# Patient Record
Sex: Female | Born: 1960 | Race: White | Hispanic: No | Marital: Married | Smoking: Former smoker
Health system: Southern US, Community
[De-identification: ages and names within clinical notes are randomized; demographics above are authoritative.]

## PROBLEM LIST (undated history)

## (undated) HISTORY — PX: CHOLECYSTECTOMY: SHX55

---

## 2003-07-21 HISTORY — PX: THYROIDECTOMY: SHX17

## 2005-07-20 HISTORY — PX: GASTRIC BYPASS: SHX52

## 2013-08-25 LAB — BASIC METABOLIC PANEL: GLUCOSE: 125 mg/dL

## 2014-02-01 LAB — HM MAMMOGRAPHY

## 2014-09-28 ENCOUNTER — Encounter: Payer: Self-pay | Admitting: Physician Assistant

## 2014-09-28 ENCOUNTER — Ambulatory Visit (INDEPENDENT_AMBULATORY_CARE_PROVIDER_SITE_OTHER): Payer: BLUE CROSS/BLUE SHIELD | Admitting: Physician Assistant

## 2014-09-28 VITALS — BP 144/86 | HR 101 | Ht 66.0 in | Wt 241.0 lb

## 2014-09-28 DIAGNOSIS — E039 Hypothyroidism, unspecified: Secondary | ICD-10-CM

## 2014-09-28 DIAGNOSIS — G4700 Insomnia, unspecified: Secondary | ICD-10-CM

## 2014-09-28 DIAGNOSIS — Z9884 Bariatric surgery status: Secondary | ICD-10-CM | POA: Diagnosis not present

## 2014-09-28 DIAGNOSIS — I10 Essential (primary) hypertension: Secondary | ICD-10-CM

## 2014-09-28 DIAGNOSIS — K2100 Gastro-esophageal reflux disease with esophagitis, without bleeding: Secondary | ICD-10-CM

## 2014-09-28 DIAGNOSIS — H1013 Acute atopic conjunctivitis, bilateral: Secondary | ICD-10-CM

## 2014-09-28 DIAGNOSIS — B009 Herpesviral infection, unspecified: Secondary | ICD-10-CM

## 2014-09-28 DIAGNOSIS — Z8619 Personal history of other infectious and parasitic diseases: Secondary | ICD-10-CM

## 2014-09-28 DIAGNOSIS — Z85850 Personal history of malignant neoplasm of thyroid: Secondary | ICD-10-CM

## 2014-09-28 MED ORDER — LISINOPRIL 10 MG PO TABS: 10.0000 mg | ORAL_TABLET | Freq: Every day | ORAL | Status: DC

## 2014-09-28 MED ORDER — METRONIDAZOLE 1 % EX GEL: Freq: Every day | CUTANEOUS | Status: DC

## 2014-09-28 NOTE — Progress Notes (Signed)
   Subjective:    Patient ID: [name], female    DOB: 01/19/1961, 54 y.o.   MRN: [id number]  HPI Pt is a 55 yo female who presents to the clinic to establish care.  She just moved here from [place].   .. Active Ambulatory Problems    Diagnosis Date Noted  . Hx of gastric bypass 09/28/2014  . Gastroesophageal reflux disease with esophagitis 09/28/2014  . Essential hypertension, benign 09/28/2014  . Thyroid activity decreased 09/28/2014  . Herpes simplex 09/28/2014  . History of thyroid cancer 09/28/2014  . Allergic conjunctivitis 10/05/2014  . Insomnia 10/05/2014   Resolved Ambulatory Problems    Diagnosis Date Noted  . No Resolved Ambulatory Problems   No Additional Past Medical History   .Marland Kitchen Family History  Problem Relation Age of Onset  . Cancer Mother     melanoma  . Hyperlipidemia Mother   . Alcohol abuse Father    .Marland Kitchen History   Social History  . Marital Status: Married    Spouse Name: N/A  . Number of Children: N/A  . Years of Education: N/A   Occupational History  . Not on file.   Social History Main Topics  . Smoking status: Former Smoker    Quit date: 03/31/1999  . Smokeless tobacco: Not on file  . Alcohol Use: 0.0 oz/week    0 Standard drinks or equivalent per week  . Drug Use: No  . Sexual Activity: Yes   Other Topics Concern  . Not on file   Social History Narrative   She has a lot of ongoing medical problems to address.   New problems  She has a mole on her breast that seems to be getting darker. No bleeding. Present for a while seems to keep growing.   She would like to discuss natural options for menopausal hot flashes.     Review of Systems  All other systems reviewed and are negative.      Objective:   Physical Exam  Constitutional: She appears well-developed and well-nourished.  HENT:  Head: Normocephalic and atraumatic.  Cardiovascular: Normal rate, regular rhythm and normal heart sounds.   Pulmonary/Chest: Effort  normal and breath sounds normal.  Skin:  Flushed cheeks with telangiectases.   Left breast wart like stuck on appearance light brown.   Psychiatric: She has a normal mood and affect. Her behavior is normal.          Assessment & Plan:    Hx of thyroid cancer/hypothyroidism- will refer to endocrinology. Pt needs regular PET scans to follow thyroid cancer.   HTN- controlled with lisinopril.   Herpes simplex- valtrex as needed for outbreaks.   GERD- omeprazole refilled as needed with zantac for daily use.   Allergic conjunctivits- consider zyrtec/claritin once daily. Will refill eye drops if needed.   Hx of lymes- was seeing rheumatologist. On prednisone 1mg  once daily. Discussed been on prednisone for a while. Start a taper every other day and see if any changes in symptoms.   SK- discuss benign but could freeze if wanted to remove.   roseaca- will try metogel at night as see if any benefits. Gave HO for other natural ways. Pt admits to drinking a lot of wine.   Insomnia/menopausal symptoms- HO given for herbal. Try unisome OTC for sleep with melatonin.   Discussed with patient may need follow up to discuss problems and follow up in detail.

## 2014-09-28 NOTE — Patient Instructions (Addendum)
unisom for sleep.  Taper down off oral prednisone.  Zyrtec/claritin at bedtime for allergies.   Rosacea Rosacea is a long-term (chronic) condition that affects the skin of the face (cheeks, nose, brow, and chin) and sometimes the eyes. Rosacea causes the blood vessels near the surface of the skin to enlarge, resulting in redness. This condition usually begins after age 54. It occurs most often in light-skinned women. Without treatment, rosacea tends to get worse over time. There is no cure for rosacea, but treatment can help control your symptoms. CAUSES  The cause is unknown. It is thought that some people may inherit a tendency to develop rosacea. Certain triggers can make your rosacea worse, including:  Hot baths.  Exercise.  Sunlight.  Very hot or cold temperatures.  Hot or spicy foods and drinks.  Drinking alcohol.  Stress.  Taking blood pressure medicine.  Long-term use of topical steroids on the face. SYMPTOMS   Redness of the face.  Red bumps or pimples on the face.  Red, enlarged nose (rhinophyma).  Blushing easily.  Red lines on the skin.  Irritated or burning feeling in the eyes.  Swollen eyelids. DIAGNOSIS  Your caregiver can usually tell what is wrong by asking about your symptoms and performing a physical exam. TREATMENT  Avoiding triggers is an important part of treatment. You will also need to see a skin specialist (dermatologist) who can develop a treatment plan for you. The goals of treatment are to control your condition and to improve the appearance of your skin. It may take several weeks or months of treatment before you notice an improvement in your skin. Even after your skin improves, you will likely need to continue treatment to prevent your rosacea from coming back. Treatment methods may include:  Using sunscreen or sunblock daily to protect the skin.  Antibiotic medicine, such as metronidazole, applied directly to the skin.  Antibiotics  taken by mouth. This is usually prescribed if you have eye problems from your rosacea.  Laser surgery to improve the appearance of the skin. This surgery can reduce the appearance of red lines on the skin and can remove excess tissue from the nose to reduce its size. HOME CARE INSTRUCTIONS  Avoid things that seem to trigger your flare-ups.  If you are given antibiotics, take them as directed. Finish them even if you start to feel better.  Use a gentle facial cleanser that does not contain alcohol.  You may use a mild facial moisturizer.  Use a sunscreen or sunblock with SPF 30 or greater.  Wear a green-tinted foundation powder to conceal redness, if needed. Choose cosmetics that are noncomedogenic. This means they do not block your pores.  If your eyelids are affected, apply warm compresses to the eyelids. Do this up to 4 times a day or as directed by your caregiver. SEEK MEDICAL CARE IF:  Your skin problems get worse.  You feel depressed.  You lose your appetite.  You have trouble concentrating.  You have problems with your eyes, such as redness or itching. MAKE SURE YOU:  Understand these instructions.  Will watch your condition.  Will get help right away if you are not doing well or get worse. Document Released: 08/13/2004 Document Revised: 01/05/2012 Document Reviewed: 06/16/2011 [facility] Patient Information 2015 [place], [place]. This information is not intended to replace advice given to you by your health care provider. Make sure you discuss any questions you have with your health care provider.   Menopause and Herbal Products  Menopause is the normal time of life when menstrual periods stop completely. Menopause is complete when you have missed 12 consecutive menstrual periods. It usually occurs between the ages of 72 to 65, with an average age of 41. Very rarely does a woman develop menopause before 54 years old. At menopause, your ovaries stop producing the female  hormones, estrogen and progesterone. This can cause undesirable symptoms and also affect your health. Sometimes the symptoms can occur 4 to 5 years before the menopause begins. There is no relationship between menopause and:  Oral contraceptives.  Number of children you had.  Race.  The age your menstrual periods started (menarche). Heavy smokers and very thin women may develop menopause earlier in life. Estrogen and progesterone hormone treatment is the usual method of treating menopausal symptoms. However, there are women who should not take hormone treatment. This is true of:   Women that have breast or uterine cancer.  Women who prefer not to take hormones because of certain side effects (abnormal uterine bleeding).  Women who are afraid that hormones may cause breast cancer.  Women who have a history of liver disease, heart disease, stroke, or blood clots. For these women, there are other medications that may help treat their menopausal symptoms. These medications are found in plants and botanical products. They can be found in the form of herbs, teas, oils, tinctures, and pills.  CAUSES:  The ovaries stop producing the female hormones estrogen and progesterone.  Other causes include:  Surgery to remove both ovaries.  The ovaries stop functioning for no know reason.  Tumors of the pituitary gland in the brain.  Medical disease that affects the ovaries and hormone production.  Radiation treatment to the abdomen or pelvis.  Chemotherapy that affects the ovaries. PHYTOESTROGENS: Phytoestrogens occur naturally in plants and plant products. They act like estrogen in the body. Herbal medications are made from these plants and botanical steroids. There are 3 types of phytoestrogens:  Isoflavones (genistein and daidzein) are found in soy, garbanzo beans, miso and tofu foods.  Ligins are found in the shell of seeds. They are used to make oils like flaxseed oil. The bacteria in  your intestine act on these foods to produce the estrogen-like hormones.  Coumestans are estrogen-like. Some of the foods they are found in include sunflower seeds and bean sprouts. CONDITIONS AND THEIR POSSIBLE HERBAL TREATMENT:  Hot flashes and night sweats.  Soy, black cohosh and evening primrose.  Irritability, insomnia, depression and memory problems.  Chasteberry, ginseng, and soy.  St. John's wort may be helpful for depression. However, there is a concern of it causing cataracts of the eye and may have bad effects on other medications. St. John's wort should not be taken for long time and without your caregiver's advice.  Loss of libido and vaginal and skin dryness.  Wild yam and soy.  Prevention of coronary heart disease and osteoporosis.  Soy and Isoflavones. Several studies have shown that some women benefit from herbal medications, but most of the studies have not consistently shown that these supplements are much better than placebo. Other forms of treatment to help women with menopausal symptoms include a balanced diet, rest, exercise, vitamin and calcium (with vitamin D) supplements, acupuncture, and group therapy when necessary. THOSE WHO SHOULD NOT TAKE HERBAL MEDICATIONS INCLUDE:  Women who are planning on getting pregnant unless told by your caregiver.  Women who are breastfeeding unless told by your caregiver.  Women who are taking other prescription medications unless  told by your caregiver.  Infants, children, and elderly women unless told by your caregiver. Different herbal medications have different and unmeasured amounts of the herbal ingredients. There are no regulations, quality control, and standardization of the ingredients in herbal medications. Therefore, the amount of the ingredient in the medication may vary from one herb, pill, tea, oil or tincture to another. Many herbal medications can cause serious problems and can even have poisonous effects if  taken too much or too long. If problems develop, the medication should be stopped and recorded by your caregiver. HOME CARE INSTRUCTIONS  Do not take or give children herbal medications without your caregiver's advice.  Let your caregiver know all the medications you are taking. This includes prescription, over-the-counter, eye drops, and creams.  Do not take herbal medications longer or more than recommended.  Tell your caregiver about any side effects from the medication. SEEK MEDICAL CARE IF:  You develop a fever of 102 F (38.9 C), or as directed by your caregiver.  You feel sick to your stomach (nauseous), vomit, or have diarrhea.  You develop a rash.  You develop abdominal pain.  You develop severe headaches.  You start to have vision problems.  You feel dizzy or faint.  You start to feel numbness in any part of your body.  You start shaking (have convulsions). Document Released: 12/23/2007 Document Revised: 06/22/2012 Document Reviewed: 07/22/2010 [facility] Patient Information 2015 [place], [place]. This information is not intended to replace advice given to you by your health care provider. Make sure you discuss any questions you have with your health care provider.

## 2014-10-05 ENCOUNTER — Encounter: Payer: Self-pay | Admitting: Physician Assistant

## 2014-10-05 DIAGNOSIS — H1010 Acute atopic conjunctivitis, unspecified eye: Secondary | ICD-10-CM | POA: Insufficient documentation

## 2014-10-05 DIAGNOSIS — Z8619 Personal history of other infectious and parasitic diseases: Secondary | ICD-10-CM | POA: Insufficient documentation

## 2014-10-05 DIAGNOSIS — G4700 Insomnia, unspecified: Secondary | ICD-10-CM | POA: Insufficient documentation

## 2014-10-05 MED ORDER — LOTEPREDNOL ETABONATE 0.2 % OP SUSP: 1.0000 [drp] | Freq: Two times a day (BID) | OPHTHALMIC | Status: DC

## 2014-10-18 ENCOUNTER — Encounter: Payer: Self-pay | Admitting: Physician Assistant

## 2014-10-26 ENCOUNTER — Ambulatory Visit (INDEPENDENT_AMBULATORY_CARE_PROVIDER_SITE_OTHER): Payer: BLUE CROSS/BLUE SHIELD | Admitting: Physician Assistant

## 2014-10-26 ENCOUNTER — Encounter: Payer: Self-pay | Admitting: Physician Assistant

## 2014-10-26 VITALS — BP 142/77 | HR 98 | Wt 244.0 lb

## 2014-10-26 DIAGNOSIS — R609 Edema, unspecified: Secondary | ICD-10-CM

## 2014-10-26 DIAGNOSIS — M25472 Effusion, left ankle: Secondary | ICD-10-CM | POA: Diagnosis not present

## 2014-10-26 LAB — BASIC METABOLIC PANEL
BUN: 15 mg/dL (ref 6–23)
CALCIUM: 9.3 mg/dL (ref 8.4–10.5)
CO2: 25 mEq/L (ref 19–32)
CREATININE: 0.6 mg/dL (ref 0.50–1.10)
Chloride: 103 mEq/L (ref 96–112)
Glucose, Bld: 114 mg/dL — ABNORMAL HIGH (ref 70–99)
Potassium: 4.6 mEq/L (ref 3.5–5.3)
Sodium: 140 mEq/L (ref 135–145)

## 2014-10-26 MED ORDER — HYDROCHLOROTHIAZIDE 12.5 MG PO TABS: ORAL_TABLET | ORAL | Status: DC

## 2014-10-26 NOTE — Progress Notes (Signed)
   Subjective:    Patient ID: Becky Blair, female    DOB: 1961-02-01, 54 y.o.   MRN: [id number]  HPI  Pt presents to the clinic with persistent swelling of both ankles but more left than right. When wakes up ankles look normal and slowly throughout the day left ankle swells 1 inch bigger than the right ankle. Becomes slightly painful. If she elevates or sleeps resolves. Been going on for over a year with no treatment. Worse in summer. No trauma to left leg.  \   Review of Systems  All other systems reviewed and are negative.      Objective:   Physical Exam  Constitutional: She is oriented to person, place, and time. She appears well-developed and well-nourished.  HENT:  Head: Normocephalic and atraumatic.  Cardiovascular: Normal rate, regular rhythm and normal heart sounds.   Pulmonary/Chest: Effort normal and breath sounds normal.  Neurological: She is alert and oriented to person, place, and time.  Skin:  No swelling in ankles today to note.  No redness or bruising over bilateral ankles.  No tenderness to palpation over bony promience of left foot and ankle.   Psychiatric: She has a normal mood and affect. Her behavior is normal.          Assessment & Plan:  Left ankle swelling/edema- bmp ordered. Discussed could be thyroid related. Pt reports just having checked and normal. Discussed likely some venous stasis with hight salt intake. Encouraged compression stockings. I did give some HCTZ 1-2 tablets as needed only. Encouraged decrease salt intake and feet elevation. If still a problem or swelling that does not resolve, worsen or pain accompany follow up.

## 2014-10-26 NOTE — Patient Instructions (Addendum)
Compression stocking- [employer]  [employer].  HCTZ- 1-2 tablets as needed for swelling.  Feet elevation.

## 2014-10-28 DIAGNOSIS — R609 Edema, unspecified: Secondary | ICD-10-CM | POA: Insufficient documentation

## 2014-10-28 DIAGNOSIS — M25472 Effusion, left ankle: Secondary | ICD-10-CM | POA: Insufficient documentation

## 2014-11-08 ENCOUNTER — Encounter: Payer: Self-pay | Admitting: Physician Assistant

## 2014-11-08 LAB — PROTEIN URINE, QUAL

## 2014-11-22 ENCOUNTER — Encounter: Payer: Self-pay | Admitting: *Deleted

## 2014-12-11 ENCOUNTER — Other Ambulatory Visit: Payer: Self-pay

## 2014-12-11 MED ORDER — HYDROCHLOROTHIAZIDE 12.5 MG PO TABS: ORAL_TABLET | ORAL | Status: DC

## 2014-12-22 ENCOUNTER — Emergency Department (INDEPENDENT_AMBULATORY_CARE_PROVIDER_SITE_OTHER)
Admission: EM | Admit: 2014-12-22 | Discharge: 2014-12-22 | Disposition: A | Discharge status: Home / Self Care | Payer: BLUE CROSS/BLUE SHIELD | Source: Home / Self Care | Attending: Emergency Medicine | Admitting: Emergency Medicine

## 2014-12-22 ENCOUNTER — Encounter: Payer: Self-pay | Admitting: *Deleted

## 2014-12-22 DIAGNOSIS — J0120 Acute ethmoidal sinusitis, unspecified: Secondary | ICD-10-CM | POA: Diagnosis not present

## 2014-12-22 MED ORDER — AMOXICILLIN-POT CLAVULANATE 875-125 MG PO TABS: 1.0000 | ORAL_TABLET | Freq: Two times a day (BID) | ORAL | Status: DC

## 2014-12-22 MED ORDER — BENZONATATE 100 MG PO CAPS: 100.0000 mg | ORAL_CAPSULE | Freq: Three times a day (TID) | ORAL | Status: DC

## 2014-12-22 NOTE — ED Notes (Signed)
Pt complains of a cough that started 1 week ago.  It has progressively gotten worse.  Cough is described as deep and productive with green mucus.

## 2014-12-22 NOTE — Discharge Instructions (Signed)

## 2014-12-22 NOTE — ED Provider Notes (Signed)
CSN: [id number]     Arrival date & time 12/22/14  1031 History   First MD Initiated Contact with Patient 12/22/14 1055     Chief Complaint  Patient presents with  . Cough   (Consider location/radiation/quality/duration/timing/severity/associated sxs/prior Treatment) Patient is a 54 y.o. female presenting with cough. The history is provided by the patient. No language interpreter was used.  Cough Cough characteristics:  Productive Sputum characteristics:  Green Severity:  Moderate Onset quality:  Gradual Duration:  1 week Timing:  Constant Progression:  Worsening Chronicity:  New Smoker: no   Context: upper respiratory infection   Relieved by:  Nothing Worsened by:  Nothing tried Ineffective treatments:  None tried Associated symptoms: rhinorrhea and sinus congestion   Risk factors: no recent infection     History reviewed. No pertinent past medical history. Past Surgical History  Procedure Laterality Date  . Thyroidectomy  2005  . Cholecystectomy    . Gastric bypass  2007   Family History  Problem Relation Age of Onset  . Cancer Mother     melanoma  . Hyperlipidemia Mother   . Alcohol abuse Father    History  Substance Use Topics  . Smoking status: Former Smoker    Quit date: 03/31/1999  . Smokeless tobacco: Not on file  . Alcohol Use: 0.0 oz/week    0 Standard drinks or equivalent per week   OB History    No data available     Review of Systems  HENT: Positive for rhinorrhea.   Respiratory: Positive for cough.   All other systems reviewed and are negative.   Allergies  Tramadol  Home Medications   Prior to Admission medications   Medication Sig Start Date End Date Taking? Authorizing Provider  acyclovir ointment (ZOVIRAX) 5 % Apply 1 application topically as needed.    Historical Provider, MD  amoxicillin-clavulanate (AUGMENTIN) 875-125 MG per tablet Take 1 tablet by mouth every 12 (twelve) hours. 12/22/14   Fransico Meadow, PA-C  benzonatate  (TESSALON) 100 MG capsule Take 1 capsule (100 mg total) by mouth every 8 (eight) hours. 12/22/14   Fransico Meadow, PA-C  BETA CAROTENE PO Take by mouth.    Historical Provider, MD  Cholecalciferol (VITAMIN D3 PO) Take 100 Int'l Units by mouth.    Historical Provider, MD  flintstones complete (FLINTSTONES) 60 MG chewable tablet Chew 1 tablet by mouth daily.    Historical Provider, MD  glucosamine-chondroitin 500-400 MG tablet Take 1 tablet by mouth.    Historical Provider, MD  hydrochlorothiazide (HYDRODIURIL) 12.5 MG tablet Take 1-2 tablets as needed for lower ankle swelling. 12/11/14   Jade L Breeback, PA-C  levothyroxine (SYNTHROID, LEVOTHROID) 175 MCG tablet Take 175 mcg by mouth daily before breakfast.    Historical Provider, MD  liothyronine (CYTOMEL) 5 MCG tablet Take 5 mcg by mouth daily.    Historical Provider, MD  lisinopril (PRINIVIL,ZESTRIL) 10 MG tablet Take 1 tablet (10 mg total) by mouth daily. 09/28/14   Jade L Breeback, PA-C  loratadine (CLARITIN) 10 MG tablet Take 10 mg by mouth daily.    Historical Provider, MD  loteprednol (LOTEMAX) 0.2 % SUSP Place 1 drop into both eyes 2 (two) times daily. 10/05/14   Donella Stade, PA-C  Magnesium 400 MG CAPS Take by mouth daily.    Historical Provider, MD  metroNIDAZOLE (METROGEL) 1 % gel Apply topically daily. 09/28/14   Jade L Breeback, PA-C  Omega-3 Fatty Acids (FISH OIL PO) Take by mouth. 2 tablets daily  Historical Provider, MD  omeprazole (PRILOSEC) 20 MG capsule Take 20 mg by mouth as needed.    Historical Provider, MD  predniSONE (DELTASONE) 1 MG tablet Take 1 mg by mouth daily with breakfast.    Historical Provider, MD  ranitidine (ZANTAC) 150 MG capsule Take 150 mg by mouth daily.    Historical Provider, MD  Tetrahydrozoline HCl (EYE DROPS OP) Apply to eye as needed. Redness 1 drop each eye    Historical Provider, MD  valACYclovir (VALTREX) 500 MG tablet Take 500 mg by mouth as needed.    Historical Provider, MD  vitamin B-12  (CYANOCOBALAMIN) 500 MCG tablet Take 500 mcg by mouth daily.    Historical Provider, MD  vitamin E 400 UNIT capsule Take 400 Units by mouth daily.    Historical Provider, MD   BP 125/81 mmHg  Pulse 96  Temp(Src) 98.6 F (37 C) (Oral)  Ht 5\' 5"  (1.651 m)  Wt 240 lb (108.863 kg)  BMI 39.94 kg/m2  SpO2 96% Physical Exam  Constitutional: She is oriented to person, place, and time. She appears well-developed and well-nourished.  HENT:  Head: Normocephalic and atraumatic.  Right Ear: External ear normal.  Left Ear: External ear normal.  Nose: Nose normal.  Mouth/Throat: Oropharynx is clear and moist.  Eyes: Conjunctivae and EOM are normal. Pupils are equal, round, and reactive to light.  Neck: Normal range of motion.  Cardiovascular: Normal rate and normal heart sounds.   Pulmonary/Chest: Effort normal.  Abdominal: Soft. She exhibits no distension.  Musculoskeletal: Normal range of motion.  Neurological: She is alert and oriented to person, place, and time.  Skin: Skin is warm.  Psychiatric: She has a normal mood and affect.  Nursing note and vitals reviewed.   ED Course  Procedures (including critical care time) Labs Review Labs Reviewed - No data to display  Imaging Review No results found.   MDM  Pt seems to have a sinusitis.  May be developing bronchitis.   Pt advised I will treat with augmentin and tessalon.   Pt advised to recheck in 3-4 day if not improving.   1. Acute ethmoidal sinusitis, recurrence not specified    AVS    Fransico Meadow, PA-C 12/22/14 1115

## 2015-01-09 ENCOUNTER — Ambulatory Visit: Payer: BLUE CROSS/BLUE SHIELD | Admitting: Family Medicine

## 2015-01-17 ENCOUNTER — Encounter: Payer: Self-pay | Admitting: Family Medicine

## 2015-01-17 DIAGNOSIS — K859 Acute pancreatitis without necrosis or infection, unspecified: Secondary | ICD-10-CM | POA: Insufficient documentation

## 2015-03-15 ENCOUNTER — Ambulatory Visit (INDEPENDENT_AMBULATORY_CARE_PROVIDER_SITE_OTHER): Payer: BLUE CROSS/BLUE SHIELD | Admitting: Physician Assistant

## 2015-03-15 ENCOUNTER — Encounter: Payer: Self-pay | Admitting: Physician Assistant

## 2015-03-15 VITALS — BP 127/82 | HR 101 | Ht 65.0 in | Wt 226.0 lb

## 2015-03-15 DIAGNOSIS — Z131 Encounter for screening for diabetes mellitus: Secondary | ICD-10-CM

## 2015-03-15 DIAGNOSIS — L821 Other seborrheic keratosis: Secondary | ICD-10-CM

## 2015-03-15 DIAGNOSIS — Z23 Encounter for immunization: Secondary | ICD-10-CM | POA: Diagnosis not present

## 2015-03-15 DIAGNOSIS — Z1159 Encounter for screening for other viral diseases: Secondary | ICD-10-CM

## 2015-03-15 DIAGNOSIS — Z0000 Encounter for general adult medical examination without abnormal findings: Secondary | ICD-10-CM | POA: Diagnosis not present

## 2015-03-15 DIAGNOSIS — I868 Varicose veins of other specified sites: Secondary | ICD-10-CM

## 2015-03-15 DIAGNOSIS — D229 Melanocytic nevi, unspecified: Secondary | ICD-10-CM

## 2015-03-15 DIAGNOSIS — Z114 Encounter for screening for human immunodeficiency virus [HIV]: Secondary | ICD-10-CM | POA: Diagnosis not present

## 2015-03-15 DIAGNOSIS — I781 Nevus, non-neoplastic: Secondary | ICD-10-CM

## 2015-03-15 DIAGNOSIS — Z13220 Encounter for screening for lipoid disorders: Secondary | ICD-10-CM

## 2015-03-15 DIAGNOSIS — M25472 Effusion, left ankle: Secondary | ICD-10-CM

## 2015-03-15 DIAGNOSIS — D1801 Hemangioma of skin and subcutaneous tissue: Secondary | ICD-10-CM

## 2015-03-15 DIAGNOSIS — I8390 Asymptomatic varicose veins of unspecified lower extremity: Secondary | ICD-10-CM

## 2015-03-15 DIAGNOSIS — G4700 Insomnia, unspecified: Secondary | ICD-10-CM

## 2015-03-15 DIAGNOSIS — Z1239 Encounter for other screening for malignant neoplasm of breast: Secondary | ICD-10-CM

## 2015-03-15 MED ORDER — OMEPRAZOLE 20 MG PO CPDR: 20.0000 mg | DELAYED_RELEASE_CAPSULE | ORAL | Status: DC | PRN

## 2015-03-15 MED ORDER — CALCIUM GLUCONATE 500 MG PO TABS: 1.0000 | ORAL_TABLET | Freq: Two times a day (BID) | ORAL | Status: DC

## 2015-03-15 MED ORDER — LISINOPRIL 10 MG PO TABS: 10.0000 mg | ORAL_TABLET | Freq: Every day | ORAL | Status: DC

## 2015-03-15 MED ORDER — FUROSEMIDE 20 MG PO TABS: 20.0000 mg | ORAL_TABLET | Freq: Every day | ORAL | Status: DC

## 2015-03-15 MED ORDER — VITAMIN D3 25 MCG (1000 UNIT) PO TABS: 1000.0000 [IU] | ORAL_TABLET | Freq: Every day | ORAL | Status: DC

## 2015-03-15 NOTE — Patient Instructions (Addendum)
Will get to vein specialist.  Try different diruretic.   Keeping You Healthy  Get These Tests  Blood Pressure- Have your blood pressure checked by your healthcare provider at least once a year.  Normal blood pressure is 120/80.  Weight- Have your body mass index (BMI) calculated to screen for obesity.  BMI is a measure of body fat based on height and weight.  You can calculate your own BMI at [employer].it  Cholesterol- Have your cholesterol checked every year.  Diabetes- Have your blood sugar checked every year if you have high blood pressure, high cholesterol, a family history of diabetes or if you are overweight.  Pap Test - Have a pap test every 1 to 5 years if you have been sexually active.  If you are older than 65 and recent pap tests have been normal you may not need additional pap tests.  In addition, if you have had a hysterectomy  for benign disease additional pap tests are not necessary.  Mammogram-Yearly mammograms are essential for early detection of breast cancer  Screening for Colon Cancer- Colonoscopy starting at age 12. Screening may begin sooner depending on your family history and other health conditions.  Follow up colonoscopy as directed by your Gastroenterologist.  Screening for Osteoporosis- Screening begins at age 86 with bone density scanning, sooner if you are at higher risk for developing Osteoporosis.  Get these medicines  Calcium with Vitamin D- Your body requires 1200-1500 mg of Calcium a day and [phone] IU of Vitamin D a day.  You can only absorb 500 mg of Calcium at a time therefore Calcium must be taken in 2 or 3 separate doses throughout the day.  Hormones- Hormone therapy has been associated with increased risk for certain cancers and heart disease.  Talk to your healthcare provider about if you need relief from menopausal symptoms.  Aspirin- Ask your healthcare provider about taking Aspirin to prevent Heart Disease and Stroke.  Get  these Immuniztions  Flu shot- Every fall  Pneumonia shot- Once after the age of 4; if you are younger ask your healthcare provider if you need a pneumonia shot.  Tetanus- Every ten years.  Zostavax- Once after the age of 73 to prevent shingles.  Take these steps  Don't smoke- Your healthcare provider can help you quit. For tips on how to quit, ask your healthcare provider or go to www.smokefree.gov or call 1-800 QUIT-NOW.  Be physically active- Exercise 5 days a week for a minimum of 30 minutes.  If you are not already physically active, start slow and gradually work up to 30 minutes of moderate physical activity.  Try walking, dancing, bike riding, swimming, etc.  Eat a healthy diet- Eat a variety of healthy foods such as fruits, vegetables, whole grains, low fat milk, low fat cheeses, yogurt, lean meats, chicken, fish, eggs, dried beans, tofu, etc.  For more information go to www.thenutritionsource.org  Dental visit- Brush and floss teeth twice daily; visit your dentist twice a year.  Eye exam- Visit your Optometrist or Ophthalmologist yearly.  Drink alcohol in moderation- Limit alcohol intake to one drink or less a day.  Never drink and drive.  Depression- Your emotional health is as important as your physical health.  If you're feeling down or losing interest in things you normally enjoy, please talk to your healthcare provider.  Seat Belts- can save your life; always wear one  Smoke/Carbon Monoxide detectors- These detectors need to be installed on the appropriate level of your home.  Replace batteries at least once a year.  Violence- If anyone is threatening or hurting you, please tell your healthcare provider.  Living Will/ Health care power of attorney- Discuss with your healthcare provider and family.

## 2015-03-15 NOTE — Progress Notes (Signed)
Subjective:    Patient ID: Becky Blair, female    DOB: 09-09-1960, 54 y.o.   MRN: [id number]  HPI   Review of Systems     Objective:   Physical Exam        Assessment & Plan:   Subjective:     Becky Blair is a 54 y.o. female and is here for a comprehensive physical exam. The patient reports problems - she is having some dermatological spots she wants me to look at. none of them bleed but they all look different. she is also having some problems sleeping. going to sleep and staying asleep. .  Pt request referral for veins they are becoming more painful.   She would also like different diuretic. She thinks HCTZ could be cause of episode of pancreatitis. Continues to have occasional left ankle swelling.   Social History   Social History  . Marital Status: Married    Spouse Name: N/A  . Number of Children: N/A  . Years of Education: N/A   Occupational History  . Not on file.   Social History Main Topics  . Smoking status: Former Smoker    Quit date: 03/31/1999  . Smokeless tobacco: Not on file  . Alcohol Use: 0.0 oz/week    0 Standard drinks or equivalent per week  . Drug Use: No  . Sexual Activity: Yes   Other Topics Concern  . Not on file   Social History Narrative   Health Maintenance  Topic Date Due  . Hepatitis C Screening  1960/12/23  . HIV Screening  08/02/1975  . MAMMOGRAM  02/02/2015  . INFLUENZA VACCINE  02/18/2015  . COLONOSCOPY  02/11/2016  . DEXA SCAN  02/23/2016  . TETANUS/TDAP  03/14/2025    The following portions of the patient's history were reviewed and updated as appropriate: allergies, current medications, past family history, past medical history, past social history, past surgical history and problem list.  Review of Systems A comprehensive review of systems was negative.   Objective:    BP 127/82 mmHg  Pulse 101  Ht 5\' 5"  (1.651 m)  Wt 226 lb (102.513 kg)  BMI 37.61 kg/m2 General appearance: alert, cooperative,  appears stated age and mildly obese Head: Normocephalic, without obvious abnormality, atraumatic Eyes: conjunctivae/corneas clear. PERRL, EOM's intact. Fundi benign. Ears: normal TM's and external ear canals both ears Nose: Nares normal. Septum midline. Mucosa normal. No drainage or sinus tenderness. Throat: lips, mucosa, and tongue normal; teeth and gums normal Neck: no adenopathy, no carotid bruit, no JVD, supple, symmetrical, trachea midline and thyroid not enlarged, symmetric, no tenderness/mass/nodules Back: symmetric, no curvature. ROM normal. No CVA tenderness. Lungs: clear to auscultation bilaterally Breasts: normal appearance, no masses or tenderness Heart: regular rate and rhythm, S1, S2 normal, no murmur, click, rub or gallop Abdomen: soft, non-tender; bowel sounds normal; no masses,  no organomegaly Pelvic: exam obscured by obesity, external genitalia normal, no adnexal masses or tenderness, no cervical motion tenderness, uterus surgically absent and vagina normal without discharge Extremities: extremities normal, atraumatic, no cyanosis or edema, varicose veins noted and (varicose veins on bilateral legs worse on right lower interior thigh than left).  Pulses: 2+ and symmetric Skin: Skin color, texture, turgor normal. No rashes or lesions or small red macules scattered over body one of concern on breast. patches of light brown slight raised with rough appearance on back and  trunk. 3 small round smooth papules on left cheek as well as 1 round smooth papule almost  flesh colored on left outer arm.  Lymph nodes: Cervical, supraclavicular, and axillary nodes normal. Neurologic: Grossly normal    Assessment:    Healthy female exam.      Plan:    CPE-Hysterectomy no pap needed. mammogram ordered. Tdap given today. Hep C and HIV screening done. Pt encouraged with diet 1500 calories a day. Keep with working out and [employer]. She is down 17lbs since last visit. Continue  supplements. Doing well.   Cherry angiomas- benign and counseling given.   Seborrheic keratosis-benign and counseling given/she does not want removed.   Non-pigmented nevus- likely nevus could also be dermatofibroma. Discussed removal and biopsy if would like. Measured at 28mm by 47mm. Watch and if grows will biopsy. Likely benign. Moles on face discussed benign.  Insomnia- natural approach with melatonin and/or valarian root. Follow up if not improving.  Left ankle swelling- cannot use spirolactone due to interaction with lisinopril. Lasix has same indication with pancreatitis. I do not see this causing that a lot though. Try lasix 20mg  as needed for swelling. Do not take daily. Continue to use conservative treatment with compression, elevation, and limiting salt.   Varicose veins- will refer to vein specialist for evaluation. Starting to cause discomfort.   See After Visit Summary for Counseling Recommendations

## 2015-03-17 DIAGNOSIS — I781 Nevus, non-neoplastic: Secondary | ICD-10-CM

## 2015-03-17 DIAGNOSIS — L821 Other seborrheic keratosis: Secondary | ICD-10-CM | POA: Insufficient documentation

## 2015-03-17 DIAGNOSIS — D229 Melanocytic nevi, unspecified: Secondary | ICD-10-CM | POA: Insufficient documentation

## 2015-03-17 DIAGNOSIS — D1801 Hemangioma of skin and subcutaneous tissue: Secondary | ICD-10-CM | POA: Insufficient documentation

## 2015-03-17 DIAGNOSIS — I8390 Asymptomatic varicose veins of unspecified lower extremity: Secondary | ICD-10-CM | POA: Insufficient documentation

## 2015-04-11 ENCOUNTER — Ambulatory Visit (INDEPENDENT_AMBULATORY_CARE_PROVIDER_SITE_OTHER): Payer: BLUE CROSS/BLUE SHIELD

## 2015-04-11 DIAGNOSIS — Z0000 Encounter for general adult medical examination without abnormal findings: Secondary | ICD-10-CM

## 2015-04-11 DIAGNOSIS — Z1239 Encounter for other screening for malignant neoplasm of breast: Secondary | ICD-10-CM

## 2015-04-11 DIAGNOSIS — Z1231 Encounter for screening mammogram for malignant neoplasm of breast: Secondary | ICD-10-CM | POA: Diagnosis not present

## 2015-04-19 ENCOUNTER — Encounter: Payer: Self-pay | Admitting: Physician Assistant

## 2015-04-19 DIAGNOSIS — R748 Abnormal levels of other serum enzymes: Secondary | ICD-10-CM | POA: Insufficient documentation

## 2015-04-19 DIAGNOSIS — E781 Pure hyperglyceridemia: Secondary | ICD-10-CM | POA: Insufficient documentation

## 2015-04-19 LAB — COMPLETE METABOLIC PANEL WITH GFR
ALBUMIN: 4.3 g/dL (ref 3.6–5.1)
ALK PHOS: 66 U/L (ref 33–130)
ALT: 19 U/L (ref 6–29)
AST: 18 U/L (ref 10–35)
BUN: 17 mg/dL (ref 7–25)
CO2: 26 mmol/L (ref 20–31)
Calcium: 9.4 mg/dL (ref 8.6–10.4)
Chloride: 104 mmol/L (ref 98–110)
Creat: 0.57 mg/dL (ref 0.50–1.05)
GFR, Est African American: >89 mL/min (ref >=60)
GFR, Est Non African American: >89 mL/min (ref >=60)
Glucose, Bld: 132 mg/dL — ABNORMAL HIGH (ref 65–99)
POTASSIUM: 4.7 mmol/L (ref 3.5–5.3)
SODIUM: 140 mmol/L (ref 135–146)
Total Bilirubin: 0.5 mg/dL (ref 0.2–1.2)
Total Protein: 6.7 g/dL (ref 6.1–8.1)

## 2015-04-19 LAB — LIPID PANEL
Cholesterol: 179 mg/dL (ref 125–200)
HDL: 39 mg/dL — ABNORMAL LOW (ref >=46)
LDL Cholesterol: 101 mg/dL (ref <130)
Total CHOL/HDL Ratio: 4.6 Ratio (ref <=5.0)
Triglycerides: 195 mg/dL — ABNORMAL HIGH (ref <150)
VLDL: 39 mg/dL — ABNORMAL HIGH (ref <30)

## 2015-04-19 LAB — HEPATITIS C ANTIBODY: HCV Ab: NEGATIVE

## 2015-04-19 LAB — VITAMIN B12: Vitamin B-12: 1381 pg/mL — ABNORMAL HIGH (ref 211–911)

## 2015-04-19 LAB — VITAMIN D 25 HYDROXY (VIT D DEFICIENCY, FRACTURES): VIT D 25 HYDROXY: 34 ng/mL (ref 30–100)

## 2015-04-19 LAB — HIV ANTIBODY (ROUTINE TESTING W REFLEX): HIV 1&2 Ab, 4th Generation: NONREACTIVE

## 2015-04-27 ENCOUNTER — Encounter: Payer: Self-pay | Admitting: Physician Assistant

## 2015-05-07 ENCOUNTER — Other Ambulatory Visit: Payer: Self-pay | Admitting: Physician Assistant

## 2015-05-07 MED ORDER — LEVOCETIRIZINE DIHYDROCHLORIDE 5 MG PO TABS: 5.0000 mg | ORAL_TABLET | Freq: Every evening | ORAL | Status: DC

## 2015-06-09 ENCOUNTER — Other Ambulatory Visit: Payer: Self-pay | Admitting: Physician Assistant

## 2015-06-10 ENCOUNTER — Other Ambulatory Visit: Payer: Self-pay | Admitting: Physician Assistant

## 2015-06-11 MED ORDER — VITAMIN D3 25 MCG (1000 UNIT) PO TABS: 1000.0000 [IU] | ORAL_TABLET | Freq: Every day | ORAL | Status: AC

## 2015-06-11 MED ORDER — CALCIUM GLUCONATE 500 MG PO TABS: 1.0000 | ORAL_TABLET | Freq: Two times a day (BID) | ORAL | Status: AC

## 2015-09-09 ENCOUNTER — Ambulatory Visit (INDEPENDENT_AMBULATORY_CARE_PROVIDER_SITE_OTHER): Payer: BLUE CROSS/BLUE SHIELD | Admitting: Physician Assistant

## 2015-09-09 ENCOUNTER — Ambulatory Visit (INDEPENDENT_AMBULATORY_CARE_PROVIDER_SITE_OTHER): Payer: BLUE CROSS/BLUE SHIELD

## 2015-09-09 ENCOUNTER — Encounter: Payer: Self-pay | Admitting: Physician Assistant

## 2015-09-09 VITALS — BP 143/59 | HR 92 | Ht 65.0 in | Wt 224.0 lb

## 2015-09-09 DIAGNOSIS — G4700 Insomnia, unspecified: Secondary | ICD-10-CM

## 2015-09-09 DIAGNOSIS — H1013 Acute atopic conjunctivitis, bilateral: Secondary | ICD-10-CM

## 2015-09-09 DIAGNOSIS — K2100 Gastro-esophageal reflux disease with esophagitis, without bleeding: Secondary | ICD-10-CM

## 2015-09-09 DIAGNOSIS — E559 Vitamin D deficiency, unspecified: Secondary | ICD-10-CM

## 2015-09-09 DIAGNOSIS — M25512 Pain in left shoulder: Secondary | ICD-10-CM | POA: Diagnosis not present

## 2015-09-09 DIAGNOSIS — M25472 Effusion, left ankle: Secondary | ICD-10-CM

## 2015-09-09 DIAGNOSIS — B009 Herpesviral infection, unspecified: Secondary | ICD-10-CM

## 2015-09-09 DIAGNOSIS — R748 Abnormal levels of other serum enzymes: Secondary | ICD-10-CM

## 2015-09-09 DIAGNOSIS — I10 Essential (primary) hypertension: Secondary | ICD-10-CM

## 2015-09-09 MED ORDER — VALACYCLOVIR HCL 500 MG PO TABS: 500.0000 mg | ORAL_TABLET | ORAL | Status: DC | PRN

## 2015-09-09 MED ORDER — TRAZODONE HCL 50 MG PO TABS: 25.0000 mg | ORAL_TABLET | Freq: Every evening | ORAL | Status: DC | PRN

## 2015-09-09 MED ORDER — FUROSEMIDE 20 MG PO TABS: ORAL_TABLET | ORAL | Status: DC

## 2015-09-09 MED ORDER — MELOXICAM 15 MG PO TABS: 15.0000 mg | ORAL_TABLET | Freq: Every day | ORAL | Status: DC

## 2015-09-09 MED ORDER — LISINOPRIL 10 MG PO TABS: 10.0000 mg | ORAL_TABLET | Freq: Every day | ORAL | Status: DC

## 2015-09-09 NOTE — Progress Notes (Addendum)
Subjective:    Patient ID: Becky Blair, female    DOB: [birth year], 55 y.o.   MRN: [id number]  HPI  Patient is a 55 year old female that presents with 3/10 non-radiating left shoulder pain for the past three weeks. No known injury. Patient characterizes shoulder pain as "aching". Patient states that pain is worsened when trying to reach behind her and above her head. Patient's pain keeps her from resting effectively at night. Patient denies any instances of trauma. Patient is right hand dominant. Patient has attempted stretches and Tylenol, which has minimally relieved her pain. Additional concerns for this visit include obtaining a refill on Lisinopril, Acyclovir, and furosemide. Patient has questions about what medications to take for seasonal allergies, GERD and sleep disturbance. Patient is concerned about her Vitamin B12 and Vitamin D levels.   Review of Systems    Please see HPI.  Objective:   Physical Exam  Constitutional: She is oriented to person, place, and time. She appears well-developed and well-nourished. No distress.  HENT:  Head: Normocephalic and atraumatic.  Right Ear: External ear normal.  Left Ear: External ear normal.  Nose: Nose normal.  Mouth/Throat: Oropharynx is clear and moist.  Patient's tympanic membranes are pearly with bony landmarks visible and without erythema bilaterally.   Posterior pharynx is erythematous.   Eyes: Conjunctivae and EOM are normal. Pupils are equal, round, and reactive to light.  Neck: Normal range of motion. Neck supple.  Cardiovascular: Normal rate, regular rhythm, normal heart sounds and intact distal pulses.   Pulmonary/Chest: Effort normal and breath sounds normal. No respiratory distress. She has no wheezes. She has no rales. She exhibits no tenderness.  Abdominal: Soft. Bowel sounds are normal. She exhibits no distension. There is no tenderness. There is no rebound and no guarding.  Musculoskeletal:  Patient has full range of  motion of the right and left upper extremities both active and passive. Patient has pain with external rotation and internal rotation. Patient has positive Hawkins test. Patient has tenderness along the lateral subacromial space. Negative empty can.  Negative drop arm sign.  Strength slightly decreased in left compareed to right.  Hand grip normal 5/5.    Neurological: She is alert and oriented to person, place, and time.  Skin: Skin is warm and dry. She is not diaphoretic.  Psychiatric: She has a normal mood and affect. Her behavior is normal. Judgment and thought content normal.       Assessment & Plan:   1. Left Shoulder Pain Differential diagnosis includes degnerative changes versus subacromial bursitis vs labrum tear. Patient has positive Luan Pulling' sign and has pain with overhead activities and with external/internal rotation. Patient will undergo x rays of left shoulder. Patient's imaging will be reviewed. Patient was prescribed Mobic to start daily for next 1-2 weeks. Exercises were given for patient to start at home. Pt did not want to consider formal PT at this time. Encouraged heat before stretches and then biofreeze after. If no improvement in next week would like for her to follow up with sports medicine doctors for injection and to consider formal PT.   2. Allergies  Patient has experienced significant eye and ear pruritis with rhinorrhea since moving to [place] from [place]. Patient was advised to continue with Zyrtec daily and eye drops as needed.   3. Peripheral edema  Patient requested a refill on furosemide for peripheral edema to be used as needed.   4. Essential hypertension Patient requested a refill on Lisinopril. Patient  expressed concerns regarding angioedema and cough as possible side effects from Lisinopril. Because patient has been taking Lisinopril for several years, patient was assured that her risk for angioedema is relatively low. Patient was advised that  cough is a benign side effect of Lisinopril as long as it is tolerable to her.   5. Sleep disturbance Patient was advised to try over the counter melatonin. Patient was prescribed Trazodone. Patient was advised to start Trazodone if melatonin is ineffective.   6. GERD Patient had questions regarding ranitidine versus omeprazole. Patient will continue with omeprazole for GERD.   7. Herpes Simplex Virus Patient requested a refill on valtrex to be used as needed.   8. Health Maintenance  Patient has a history of elevated Vitamin B12 levels. Vitamin B12 and Vitamin D will be rechecked to ensure that levels are within range.

## 2015-09-09 NOTE — Patient Instructions (Signed)
Biofreeze

## 2015-09-10 ENCOUNTER — Encounter: Payer: Self-pay | Admitting: Physician Assistant

## 2015-09-10 DIAGNOSIS — M7542 Impingement syndrome of left shoulder: Secondary | ICD-10-CM | POA: Insufficient documentation

## 2015-09-10 LAB — VITAMIN B12: Vitamin B-12: 684 pg/mL (ref 200–1100)

## 2015-09-10 LAB — VITAMIN D 25 HYDROXY (VIT D DEFICIENCY, FRACTURES): VIT D 25 HYDROXY: 29 ng/mL — AB (ref 30–100)

## 2015-11-29 ENCOUNTER — Ambulatory Visit (INDEPENDENT_AMBULATORY_CARE_PROVIDER_SITE_OTHER): Payer: BLUE CROSS/BLUE SHIELD | Admitting: Sports Medicine

## 2015-11-29 ENCOUNTER — Ambulatory Visit (INDEPENDENT_AMBULATORY_CARE_PROVIDER_SITE_OTHER): Payer: BLUE CROSS/BLUE SHIELD

## 2015-11-29 ENCOUNTER — Encounter: Payer: Self-pay | Admitting: Sports Medicine

## 2015-11-29 VITALS — BP 118/79 | HR 83 | Resp 18 | Wt 221.5 lb

## 2015-11-29 DIAGNOSIS — M5416 Radiculopathy, lumbar region: Secondary | ICD-10-CM

## 2015-11-29 DIAGNOSIS — M7062 Trochanteric bursitis, left hip: Secondary | ICD-10-CM

## 2015-11-29 DIAGNOSIS — M7542 Impingement syndrome of left shoulder: Secondary | ICD-10-CM

## 2015-11-29 NOTE — Assessment & Plan Note (Signed)
Injection as above, return in one month. 

## 2015-11-29 NOTE — Assessment & Plan Note (Signed)
Has failed conservative measures, subacromial injection as above, return in one month.

## 2015-11-29 NOTE — Assessment & Plan Note (Signed)
Left L5 distribution, continue meloxicam, adding formal physical therapy, x-rays. Return in one month, MRI for interventional planning if no better.

## 2015-11-29 NOTE — Progress Notes (Signed)
Subjective:    I'm seeing this patient as a consultation for:  Becky Planas, PA-C  CC: Left shoulder pain, left hip pain, radiating left leg pain  HPI: Left shoulder pain: Localized over the deltoid and worse with overhead activities, internal rotation, has had x-rays and in some therapy, used NSAIDs without much improvement. Pain is waking her from sleep.  Leg pain: Radiating down the lateral aspect of the thigh, lower leg, to the dorsum of the foot. Associated with mid back pain, worse with sitting, flexion, Valsalva, no bowel or bladder dysfunction, saddle numbness, constitutional symptoms.  Left hip pain: Localized over the greater trochanter, worse with laying on the lateral side.  Past medical history, Surgical history, Family history not pertinant except as noted below, Social history, Allergies, and medications have been entered into the medical record, reviewed, and no changes needed.   Review of Systems: No headache, visual changes, nausea, vomiting, diarrhea, constipation, dizziness, abdominal pain, skin rash, fevers, chills, night sweats, weight loss, swollen lymph nodes, body aches, joint swelling, muscle aches, chest pain, shortness of breath, mood changes, visual or auditory hallucinations.   Objective:   General: Well Developed, well nourished, and in no acute distress.  Neuro/Psych: Alert and oriented x3, extra-ocular muscles intact, able to move all 4 extremities, sensation grossly intact. Skin: Warm and dry, no rashes noted.  Respiratory: Not using accessory muscles, speaking in full sentences, trachea midline.  Cardiovascular: Pulses palpable, no extremity edema. Abdomen: Does not appear distended. Left Hip: ROM IR: 60 Deg, ER: 60 Deg, Flexion: 120 Deg, Extension: 100 Deg, Abduction: 45 Deg, Adduction: 45 Deg Strength IR: 5/5, ER: 5/5, Flexion: 5/5, Extension: 5/5, Abduction: 5/5, Adduction: 5/5 Pelvic alignment unremarkable to inspection and palpation. Standing  hip rotation and gait without trendelenburg / unsteadiness. Greater trochanter withtenderness to palpation. No tenderness over piriformis. No SI joint tenderness and normal minimal SI movement. Left Shoulder: Inspection reveals no abnormalities, atrophy or asymmetry. Palpation is normal with no tenderness over AC joint or bicipital groove. ROM is full in all planes. Rotator cuff strength normal throughout. Positive Neer and Hawkin's tests, empty can. Speeds and Yergason's tests normal. No labral pathology noted with negative Obrien's, negative crank, negative clunk, and good stability. Normal scapular function observed. No painful arc and no drop arm sign. No apprehension sign  Procedure: Real-time Ultrasound Guided Injection of left subacromial bursa Device: GE Logiq E  Verbal informed consent obtained.  Time-out conducted.  Noted no overlying erythema, induration, or other signs of local infection.  Skin prepped in a sterile fashion.  Local anesthesia: Topical Ethyl chloride.  With sterile technique and under real time ultrasound guidance:  Noted subacromial bursitis, 25-gauge needle advanced into the bursa and I injected 1 mL lidocaine, 1 mL Marcaine, 1 mL kenalog 40. Completed without difficulty  Pain immediately resolved suggesting accurate placement of the medication.  Advised to call if fevers/chills, erythema, induration, drainage, or persistent bleeding.  Images permanently stored and available for review in the ultrasound unit.  Impression: Technically successful ultrasound guided injection.  Procedure: Real-time Ultrasound Guided Injection of left trochanteric bursa Device: GE Logiq E  Verbal informed consent obtained.  Time-out conducted.  Noted no overlying erythema, induration, or other signs of local infection.  Skin prepped in a sterile fashion.  Local anesthesia: Topical Ethyl chloride.  With sterile technique and under real time ultrasound guidance:  Using a  spinal needle injected 1 mL kenalog 40, 2 mL lidocaine, 2 mL Marcaine Completed without difficulty  Pain immediately resolved suggesting accurate placement of the medication.  Advised to call if fevers/chills, erythema, induration, drainage, or persistent bleeding.  Images permanently stored and available for review in the ultrasound unit.  Impression: Technically successful ultrasound guided injection.  Impression and Recommendations:   This case required medical decision making of moderate complexity.

## 2015-12-12 ENCOUNTER — Encounter: Payer: Self-pay | Admitting: Rehabilitative and Restorative Service Providers"

## 2015-12-12 ENCOUNTER — Ambulatory Visit (INDEPENDENT_AMBULATORY_CARE_PROVIDER_SITE_OTHER): Payer: BLUE CROSS/BLUE SHIELD | Admitting: Rehabilitative and Restorative Service Providers"

## 2015-12-12 DIAGNOSIS — M791 Myalgia, unspecified site: Secondary | ICD-10-CM

## 2015-12-12 DIAGNOSIS — M25512 Pain in left shoulder: Secondary | ICD-10-CM | POA: Diagnosis not present

## 2015-12-12 DIAGNOSIS — R29898 Other symptoms and signs involving the musculoskeletal system: Secondary | ICD-10-CM

## 2015-12-12 NOTE — Patient Instructions (Signed)
Axial Extension (Chin Tuck)    Pull chin in and lengthen back of neck. Hold __10__ seconds while counting out loud. Repeat __5-10__ times. Do __several __ sessions per day.  Shoulder Blade Squeeze    Rotate shoulders back, then squeeze shoulder blades down and back. Hold 10 sec Repeat __10__ times. Do ___several _ sessions per day. Can use swim noodle   Scapula Adduction With Pectoralis Stretch: Low - Standing   Shoulders at 45 hands even with shoulders, keeping weight through legs, shift weight forward until you feel pull or stretch through the front of your chest. Hold _30__ seconds. Do _3__ times, _2-4__ times per day.   Scapula Adduction With Pectoralis Stretch: Mid-Range - Standing   Shoulders at 90 elbows even with shoulders, keeping weight through legs, shift weight forward until you feel pull or strength through the front of your chest. Hold __30_ seconds. Do _3__ times, __2-4_ times per day.   Scapula Adduction With Pectoralis Stretch: High - Standing   Shoulders at 120 hands up high on the doorway, keeping weight on feet, shift weight forward until you feel pull or stretch through the front of your chest. Hold _30__ seconds. Do _3__ times, _2-3__ times per day.   Lying on back with arms out to side - comfortable for 3-5 minutes Can progress to use noodle along your spine  No pain - just a stretch. Can bend elbows to release the tightness then straighten back out   Resisted External Rotation: in Neutral - Bilateral   PALMS UP Sit or stand, tubing in both hands, elbows at sides, bent to 90, forearms level. Relax your shoulders. Pinch shoulder blades together and rotate forearms out. Keep elbows at sides. Repeat __10__ times per set. Do _2-3___ sets per session. Do _2-3___ sessions per day.   Use ~4 inch rubber ball for self massage    TENS UNIT: This is helpful for muscle pain and spasm.   Search and Purchase a TENS 7000 2nd edition at  www.tenspros.com. It should be less than $30.     TENS unit instructions: Do not shower or bathe with the unit on Turn the unit off before removing electrodes or batteries If the electrodes lose stickiness add a drop of water to the electrodes after they are disconnected from the unit and place on plastic sheet. If you continued to have difficulty, call the TENS unit company to purchase more electrodes. Do not apply lotion on the skin area prior to use. Make sure the skin is clean and dry as this will help prolong the life of the electrodes. After use, always check skin for unusual red areas, rash or other skin difficulties. If there are any skin problems, does not apply electrodes to the same area. Never remove the electrodes from the unit by pulling the wires. Do not use the TENS unit or electrodes other than as directed. Do not change electrode placement without consultating your therapist or physician. Keep 2 fingers with between each electrode.    Sleeping on Back  Place pillow under knees. A pillow with cervical support and a roll around waist are also helpful. Copyright  VHI. All rights reserved.  Sleeping on Side Place pillow between knees. Use cervical support under neck and a roll around waist as needed. Copyright  VHI. All rights reserved.   Sleeping on Stomach   If this is the only desirable sleeping position, place pillow under lower legs, and under stomach or chest as needed.  Posture -  Sitting   Sit upright, head facing forward. Try using a roll to support lower back. Keep shoulders relaxed, and avoid rounded back. Keep hips level with knees. Avoid crossing legs for long periods. Stand to Sit / Sit to Stand   To sit: Bend knees to lower self onto front edge of chair, then scoot back on seat. To stand: Reverse sequence by placing one foot forward, and scoot to front of seat. Use rocking motion to stand up.   Work Height and Reach  Ideal work height is no more  than 2 to 4 inches below elbow level when standing, and at elbow level when sitting. Reaching should be limited to arm's length, with elbows slightly bent.  Bending  Bend at hips and knees, not back. Keep feet shoulder-width apart.    Posture - Standing   Good posture is important. Avoid slouching and forward head thrust. Maintain curve in low back and align ears over shoul- ders, hips over ankles.  Alternating Positions   Alternate tasks and change positions frequently to reduce fatigue and muscle tension. Take rest breaks. Computer Work   Position work to Programmer, multimedia. Use proper work and seat height. Keep shoulders back and down, wrists straight, and elbows at right angles. Use chair that provides full back support. Add footrest and lumbar roll as needed.  Getting Into / Out of Car  Lower self onto seat, scoot back, then bring in one leg at a time. Reverse sequence to get out.  Dressing  Lie on back to pull socks or slacks over feet, or sit and bend leg while keeping back straight.    Housework - Sink  Place one foot on ledge of cabinet under sink when standing at sink for prolonged periods.   Pushing / Pulling  Pushing is preferable to pulling. Keep back in proper alignment, and use leg muscles to do the work.  Deep Squat   Squat and lift with both arms held against upper trunk. Tighten stomach muscles without holding breath. Use smooth movements to avoid jerking.  Avoid Twisting   Avoid twisting or bending back. Pivot around using foot movements, and bend at knees if needed when reaching for articles.  Carrying Luggage   Distribute weight evenly on both sides. Use a cart whenever possible. Do not twist trunk. Move body as a unit.   Lifting Principles .Maintain proper posture and head alignment. .Slide object as close as possible before lifting. .Move obstacles out of the way. .Test before lifting; ask for help if too heavy. .Tighten stomach muscles  without holding breath. .Use smooth movements; do not jerk. .Use legs to do the work, and pivot with feet. .Distribute the work load symmetrically and close to the center of trunk. .Push instead of pull whenever possible.   Ask For Help   Ask for help and delegate to others when possible. Coordinate your movements when lifting together, and maintain the low back curve.  Log Roll   Lying on back, bend left knee and place left arm across chest. Roll all in one movement to the right. Reverse to roll to the left. Always move as one unit. Housework - Sweeping  Use long-handled equipment to avoid stooping.   Housework - Wiping  Position yourself as close as possible to reach work surface. Avoid straining your back.  Laundry - Unloading Wash   To unload small items at bottom of washer, lift leg opposite to arm being used to reach.  [place]  close to area to be raked. Use arm movements to do the work. Keep back straight and avoid twisting.     Cart  When reaching into cart with one arm, lift opposite leg to keep back straight.   Getting Into / Out of Bed  Lower self to lie down on one side by raising legs and lowering head at the same time. Use arms to assist moving without twisting. Bend both knees to roll onto back if desired. To sit up, start from lying on side, and use same move-ments in reverse. Housework - Vacuuming  Hold the vacuum with arm held at side. Step back and forth to move it, keeping head up. Avoid twisting.   Laundry - IT consultant so that bending and twisting can be avoided.   Laundry - Unloading Dryer  Squat down to reach into clothes dryer or use a reacher.  Gardening - Weeding / Probation officer or Kneel. Knee pads may be helpful.

## 2015-12-12 NOTE — Therapy (Addendum)
[address] [place], [place], [address] Phone: [phone]   Fax:  [phone]  Physical Therapy Evaluation  Patient Details  Name: Becky Blair MRN: [id number] Date of Birth: May 28, 1961 Referring Provider: Dr. Dianah Field   Encounter Date: 12/12/2015      PT End of Session - 12/12/15 1705    Visit Number 1   Number of Visits 12   Date for PT Re-Evaluation 01/23/16   PT Start Time 1706   PT Stop Time 1759   PT Time Calculation (min) 53 min   Activity Tolerance Patient tolerated treatment well      History reviewed. No pertinent past medical history.  Past Surgical History  Procedure Laterality Date  . Thyroidectomy  2005  . Cholecystectomy    . Gastric bypass  2007    There were no vitals filed for this visit.       Subjective Assessment - 12/12/15 1714    Subjective Patient reports onset of Lt shoulder pain over the past couple of months with no known injury. She received an injection with good improvement.    Pertinent History Intermittnet LBP Dx with some sort of a virus ~ 3 yrs ago with multiple problems treated wtih predisone with improvement. Lt hip bursitis    Diagnostic tests xrays    Patient Stated Goals learn exercise of shoulder    Currently in Pain? No/denies            [facility] PT Assessment - 12/12/15 0001    Assessment   Medical Diagnosis Lt shoulder impingement    Referring Provider Dr. Dianah Field    Onset Date/Surgical Date 09/18/15   Hand Dominance Right   Next MD Visit 12/27/15   Prior Therapy none   Precautions   Precautions None   Balance Screen   Has the patient fallen in the past 6 months No   Has the patient had a decrease in activity level because of a fear of falling?  No   Is the patient reluctant to leave their home because of a fear of falling?  No   Prior Function   Level of Independence Independent   Vocation Full time employment   Vocation  Requirements desk/computer 8 hr/day for 18 yrs    Leisure walking 6 d/wk 1 1/2 hours - 3 days/wk at gym cardio/machines ~ 1 hour - household chores; Medical sales representative; cooking    Sensation   Additional Comments WFL's UE's    Posture/Postural Control   Posture Comments head forward; shoulders rounded and elevated; increased thoracic kyphosis; scapulae abducted and rotated along the thoracic wall    AROM   Right Shoulder Extension 65 Degrees   Right Shoulder Flexion 159 Degrees   Right Shoulder ABduction 164 Degrees   Right Shoulder Internal Rotation 43 Degrees   Right Shoulder External Rotation 90 Degrees   Left Shoulder Extension 61 Degrees   Left Shoulder Flexion 150 Degrees   Left Shoulder ABduction 159 Degrees   Left Shoulder Internal Rotation 38 Degrees   Left Shoulder External Rotation 90 Degrees   Strength   Overall Strength Comments 5/5 bilat UE's    Palpation   Palpation comment tight pecs; biceps; upper trap; leveator; teres Lt > Rt                    [facility] Adult PT Treatment/Exercise - 12/12/15 0001    Self-Care   Self-Care --  spine care education    Neuro Re-ed  Neuro Re-ed Details  postural corection/education    Shoulder Exercises: Standing   Retraction Both;Strengthening;20 reps;Theraband   Theraband Level (Shoulder Retraction) Level 1 (Yellow)   Other Standing Exercises scap squeeze 10 sec x 10    Other Standing Exercises axial extension 10 sec x 5    Shoulder Exercises: Therapy Ball   Other Therapy Ball Exercises myofacial ball release work    Shoulder Exercises: Stretch   Other Shoulder Stretches 3 way doorway 30 sec x 2                   PT Short Term Goals - 12/12/15 1809    PT SHORT TERM GOAL #1   Title Improve posture and alignment  01/09/16   Time 4   Period Weeks   Status New   PT SHORT TERM GOAL #2   Title Increase AROM Lt shoulder to equal Rt 01/09/16   Time 4   Period Weeks   Status New   PT SHORT TERM GOAL #3   Title  Independent in HEP and safe gym program 01/09/16   Time 4   Period Weeks   Status New                  Plan - 12/12/15 1805    Clinical Impression Statement Patient presents with ~2 month history of Lt shoulder pain and dysfunciton. She received an injection 11/29/15 with significant improvement. She is also working out in the gym three days per week. Patient presents with poor posture and alignment; limited Lt shoulder ROM compared to Rt; tenderness and tightness to palpation through the anterior Lt shoudler.    Rehab Potential Good   PT Frequency 1x / week   PT Duration 4 weeks   PT Treatment/Interventions Patient/family education;ADLs/Self Care Home Management;Cryotherapy;Electrical Stimulation;Moist Heat;Ultrasound;Manual techniques;Dry needling;Neuromuscular re-education;Iontophoresis 37m/ml Dexamethasone;Therapeutic activities;Therapeutic exercise   PT Next Visit Plan progress with stretching and posterior shoulder girdle strengthening - postural correction    Consulted and Agree with Plan of Care Patient      Patient will benefit from skilled therapeutic intervention in order to improve the following deficits and impairments:  Postural dysfunction, Improper body mechanics, Pain, Decreased range of motion, Decreased mobility, Increased fascial restricitons, Increased muscle spasms, Decreased activity tolerance  Visit Diagnosis: Pain in left shoulder - Plan: PT plan of care cert/re-cert  Myalgia - Plan: PT plan of care cert/re-cert  Other symptoms and signs involving the musculoskeletal system - Plan: PT plan of care cert/re-cert     Problem List Patient Active Problem List   Diagnosis Date Noted  . Trochanteric bursitis of left hip 11/29/2015  . Left lumbar radiculopathy 11/29/2015  . Impingement syndrome of left shoulder 09/10/2015  . Hypertriglyceridemia 04/19/2015  . Elevated vitamin B12 level 04/19/2015  . Nonpigmented nevus 03/17/2015  . Seborrheic keratosis  03/17/2015  . Cherry angioma 03/17/2015  . Varicose veins 03/17/2015  . Pancreatitis, acute 01/17/2015  . Left ankle swelling 10/28/2014  . Edema 10/28/2014  . Allergic conjunctivitis 10/05/2014  . Insomnia 10/05/2014  . Hx of gastric bypass 09/28/2014  . Gastroesophageal reflux disease with esophagitis 09/28/2014  . Essential hypertension, benign 09/28/2014  . Thyroid activity decreased 09/28/2014  . Herpes simplex 09/28/2014  . History of thyroid cancer 09/28/2014    Avyanna Spada PNilda SimmerPT, MPH  12/12/2015, 6:14 PM  [facility] [place] [address]Phone: [phone]  Fax:  [phone] Name: Becky GallawayMRN:  [id number] Date of Birth: April 16, 1961   PHYSICAL THERAPY DISCHARGE SUMMARY  Visits from Start of Care: eval only  Current functional level related to goals / functional outcomes: See eval for discharge status - unchanged   Remaining deficits: unchanged   Education / Equipment: HEP Plan: Patient agrees to discharge.  Patient goals were not met. Patient is being discharged due to not returning since the last visit.  ?????     Jermany Rimel P. Helene Kelp PT, MPH 03/04/16 3:52 PM

## 2015-12-27 ENCOUNTER — Ambulatory Visit: Payer: BLUE CROSS/BLUE SHIELD | Admitting: Sports Medicine

## 2016-01-15 ENCOUNTER — Ambulatory Visit (INDEPENDENT_AMBULATORY_CARE_PROVIDER_SITE_OTHER): Payer: BLUE CROSS/BLUE SHIELD | Admitting: Physician Assistant

## 2016-01-15 ENCOUNTER — Encounter: Payer: Self-pay | Admitting: Physician Assistant

## 2016-01-15 VITALS — BP 142/80 | HR 78 | Ht 65.0 in | Wt 223.0 lb

## 2016-01-15 DIAGNOSIS — K2990 Gastroduodenitis, unspecified, without bleeding: Secondary | ICD-10-CM

## 2016-01-15 DIAGNOSIS — K0889 Other specified disorders of teeth and supporting structures: Secondary | ICD-10-CM | POA: Diagnosis not present

## 2016-01-15 DIAGNOSIS — IMO0001 Reserved for inherently not codable concepts without codable children: Secondary | ICD-10-CM

## 2016-01-15 DIAGNOSIS — K2970 Gastritis, unspecified, without bleeding: Secondary | ICD-10-CM

## 2016-01-15 DIAGNOSIS — R030 Elevated blood-pressure reading, without diagnosis of hypertension: Secondary | ICD-10-CM | POA: Diagnosis not present

## 2016-01-15 MED ORDER — OMEPRAZOLE 40 MG PO CPDR: 40.0000 mg | DELAYED_RELEASE_CAPSULE | Freq: Two times a day (BID) | ORAL | Status: DC

## 2016-01-15 MED ORDER — HYDROCODONE-ACETAMINOPHEN 5-325 MG PO TABS: 1.0000 | ORAL_TABLET | Freq: Three times a day (TID) | ORAL | Status: DC | PRN

## 2016-01-15 NOTE — Progress Notes (Signed)
   Subjective:    Patient ID: [name], female    DOB: 1960-10-08, 55 y.o.   MRN: [id number]  HPI Pt is a 55 yo female who presents to the clinic with BP and stomach concerns. She went to the dentist about 3 weeks ago and was told she had a crack in her tooth and needed a filling. She got the filling right on spot. She has had pain every since from that tooth and getting the run around from the dentist. She has been taking aleve and ibuprofen around the clock every 4-6 hours up to 800mg  of motrin and 1000mg  of naproxen. Now her epigastric area feels full and achy. She is having some reflux symptoms.    Review of Systems  All other systems reviewed and are negative.      Objective:   Physical Exam  Constitutional: She is oriented to person, place, and time. She appears well-developed and well-nourished.  HENT:  Head: Normocephalic and atraumatic.  Cardiovascular: Normal rate, regular rhythm and normal heart sounds.   Pulmonary/Chest: Effort normal and breath sounds normal.  Abdominal:  Tenderness, mild in epigastric area. No rebound or guarding.   Neurological: She is alert and oriented to person, place, and time.  Psychiatric: She has a normal mood and affect. Her behavior is normal.          Assessment & Plan:  Tooth pain- follow up with dentist to fix the problem. norco given to use for pain as needed.   Elevated BP- discussed with patient I believe elevation is due to pain and stress with the situation. Will recheck BP after tooth pain resolved.   Gastritis- GI cocktail given in office. STOP all NSAID. Omeprazole increased to 40mg  bid for next 2 weeks. norco to replace NSAID for pain. Discussed cmp, cbc, lipase. Pt declined. If worsening will need to consider.

## 2016-01-15 NOTE — Patient Instructions (Addendum)
Stop ibuprofen.  Take norco as needed.  Omeprazole twice a day 2 weeks.

## 2016-01-17 ENCOUNTER — Encounter: Payer: Self-pay | Admitting: Physician Assistant

## 2016-01-17 DIAGNOSIS — F329 Major depressive disorder, single episode, unspecified: Secondary | ICD-10-CM | POA: Insufficient documentation

## 2016-03-05 ENCOUNTER — Other Ambulatory Visit: Payer: Self-pay | Admitting: *Deleted

## 2016-03-05 ENCOUNTER — Other Ambulatory Visit: Payer: Self-pay | Admitting: Physician Assistant

## 2016-03-05 MED ORDER — OMEPRAZOLE 20 MG PO CPDR: 20.0000 mg | DELAYED_RELEASE_CAPSULE | Freq: Every day | ORAL | 3 refills | Status: DC

## 2016-03-17 ENCOUNTER — Encounter: Payer: Self-pay | Admitting: Physician Assistant

## 2016-03-17 ENCOUNTER — Ambulatory Visit (INDEPENDENT_AMBULATORY_CARE_PROVIDER_SITE_OTHER): Payer: BLUE CROSS/BLUE SHIELD | Admitting: Physician Assistant

## 2016-03-17 VITALS — BP 140/73 | HR 74 | Ht 66.0 in | Wt 228.0 lb

## 2016-03-17 DIAGNOSIS — B009 Herpesviral infection, unspecified: Secondary | ICD-10-CM

## 2016-03-17 DIAGNOSIS — E781 Pure hyperglyceridemia: Secondary | ICD-10-CM

## 2016-03-17 DIAGNOSIS — I10 Essential (primary) hypertension: Secondary | ICD-10-CM | POA: Diagnosis not present

## 2016-03-17 DIAGNOSIS — Z0000 Encounter for general adult medical examination without abnormal findings: Secondary | ICD-10-CM

## 2016-03-17 DIAGNOSIS — M25472 Effusion, left ankle: Secondary | ICD-10-CM | POA: Diagnosis not present

## 2016-03-17 DIAGNOSIS — E669 Obesity, unspecified: Secondary | ICD-10-CM

## 2016-03-17 MED ORDER — FUROSEMIDE 20 MG PO TABS: ORAL_TABLET | ORAL | 1 refills | Status: AC

## 2016-03-17 MED ORDER — PHENTERMINE HCL 37.5 MG PO TABS: 37.5000 mg | ORAL_TABLET | Freq: Every day | ORAL | 0 refills | Status: AC

## 2016-03-17 MED ORDER — LEVOTHYROXINE SODIUM 175 MCG PO TABS: 175.0000 ug | ORAL_TABLET | Freq: Every day | ORAL | Status: AC

## 2016-03-17 MED ORDER — ACYCLOVIR 5 % EX OINT: 1.0000 "application " | TOPICAL_OINTMENT | CUTANEOUS | 5 refills | Status: AC | PRN

## 2016-03-17 MED ORDER — VALACYCLOVIR HCL 500 MG PO TABS: ORAL_TABLET | ORAL | 1 refills | Status: AC

## 2016-03-17 NOTE — Patient Instructions (Addendum)
Need to check glucose, get mammogram, get colonoscopy.   Keeping You Healthy  Get These Tests  Blood Pressure- Have your blood pressure checked by your healthcare provider at least once a year.  Normal blood pressure is 120/80.  Weight- Have your body mass index (BMI) calculated to screen for obesity.  BMI is a measure of body fat based on height and weight.  You can calculate your own BMI at [employer].it  Cholesterol- Have your cholesterol checked every year.  Diabetes- Have your blood sugar checked every year if you have high blood pressure, high cholesterol, a family history of diabetes or if you are overweight.  Pap Test - Have a pap test every 1 to 5 years if you have been sexually active.  If you are older than 65 and recent pap tests have been normal you may not need additional pap tests.  In addition, if you have had a hysterectomy  for benign disease additional pap tests are not necessary.  Mammogram-Yearly mammograms are essential for early detection of breast cancer  Screening for Colon Cancer- Colonoscopy starting at age 51. Screening may begin sooner depending on your family history and other health conditions.  Follow up colonoscopy as directed by your Gastroenterologist.  Screening for Osteoporosis- Screening begins at age 24 with bone density scanning, sooner if you are at higher risk for developing Osteoporosis.  Get these medicines  Calcium with Vitamin D- Your body requires 1200-1500 mg of Calcium a day and [phone] IU of Vitamin D a day.  You can only absorb 500 mg of Calcium at a time therefore Calcium must be taken in 2 or 3 separate doses throughout the day.  Hormones- Hormone therapy has been associated with increased risk for certain cancers and heart disease.  Talk to your healthcare provider about if you need relief from menopausal symptoms.  Aspirin- Ask your healthcare provider about taking Aspirin to prevent Heart Disease and Stroke.  Get these  Immuniztions  Flu shot- Every fall  Pneumonia shot- Once after the age of 47; if you are younger ask your healthcare provider if you need a pneumonia shot.  Tetanus- Every ten years.  Zostavax- Once after the age of 52 to prevent shingles.  Take these steps  Don't smoke- Your healthcare provider can help you quit. For tips on how to quit, ask your healthcare provider or go to www.smokefree.gov or call 1-800 QUIT-NOW.  Be physically active- Exercise 5 days a week for a minimum of 30 minutes.  If you are not already physically active, start slow and gradually work up to 30 minutes of moderate physical activity.  Try walking, dancing, bike riding, swimming, etc.  Eat a healthy diet- Eat a variety of healthy foods such as fruits, vegetables, whole grains, low fat milk, low fat cheeses, yogurt, lean meats, chicken, fish, eggs, dried beans, tofu, etc.  For more information go to www.thenutritionsource.org  Dental visit- Brush and floss teeth twice daily; visit your dentist twice a year.  Eye exam- Visit your Optometrist or Ophthalmologist yearly.  Drink alcohol in moderation- Limit alcohol intake to one drink or less a day.  Never drink and drive.  Depression- Your emotional health is as important as your physical health.  If you're feeling down or losing interest in things you normally enjoy, please talk to your healthcare provider.  Seat Belts- can save your life; always wear one  Smoke/Carbon Monoxide detectors- These detectors need to be installed on the appropriate level of your home.  Replace batteries at least once a year.  Violence- If anyone is threatening or hurting you, please tell your healthcare provider.  Living Will/ Health care power of attorney- Discuss with your healthcare provider and family.

## 2016-03-17 NOTE — Progress Notes (Signed)
Subjective:     Becky Blair is a 55 y.o. female and is here for a comprehensive physical exam. The patient reports problems - pt is moving to [place] and needs medication refills. .  Social History   Social History  . Marital status: Married    Spouse name: N/A  . Number of children: N/A  . Years of education: N/A   Occupational History  . Not on file.   Social History Main Topics  . Smoking status: Former Smoker    Quit date: 03/31/1999  . Smokeless tobacco: Not on file  . Alcohol use 0.0 oz/week  . Drug use: No  . Sexual activity: Yes   Other Topics Concern  . Not on file   Social History Narrative  . No narrative on file   Health Maintenance  Topic Date Due  . COLONOSCOPY  02/11/2016  . DEXA SCAN  02/23/2016  . INFLUENZA VACCINE  03/17/2017 (Originally 02/18/2016)  . MAMMOGRAM  04/10/2016  . TETANUS/TDAP  03/14/2025  . Hepatitis C Screening  Completed  . HIV Screening  Completed    The following portions of the patient's history were reviewed and updated as appropriate: allergies, current medications, past family history, past medical history, past social history, past surgical history and problem list.  Review of Systems A comprehensive review of systems was negative.   Objective:    BP 140/73   Pulse 74   Ht 5\' 6"  (1.676 m)   Wt 228 lb (103.4 kg)   BMI 36.80 kg/m  General appearance: alert, cooperative and appears stated age Head: Normocephalic, without obvious abnormality, atraumatic Eyes: conjunctivae/corneas clear. PERRL, EOM's intact. Fundi benign. Ears: normal TM's and external ear canals both ears Nose: Nares normal. Septum midline. Mucosa normal. No drainage or sinus tenderness. Throat: lips, mucosa, and tongue normal; teeth and gums normal Neck: no adenopathy, no carotid bruit, no JVD, supple, symmetrical, trachea midline and thyroid not enlarged, symmetric, no tenderness/mass/nodules Back: symmetric, no curvature. ROM normal. No CVA  tenderness. Lungs: clear to auscultation bilaterally Breasts: normal appearance, no masses or tenderness Heart: regular rate and rhythm, S1, S2 normal, no murmur, click, rub or gallop Abdomen: soft, non-tender; bowel sounds normal; no masses,  no organomegaly Extremities: extremities normal, atraumatic, no cyanosis or edema Pulses: 2+ and symmetric Skin: Skin color, texture, turgor normal. No rashes or lesions Lymph nodes: Cervical, supraclavicular, and axillary nodes normal. Neurologic: Alert and oriented X 3, normal strength and tone. Normal symmetric reflexes. Normal coordination and gait    Assessment:    Healthy female exam.      Plan:    CPE/hypertriglycemia- no need for pap due to hysterectomy.  DEXA done in 2015(osteopenia) done due to prednisone usage for 2 years straight. Suggest having done at least once more before screening age of 6.  Needs colonoscopy.  Pt declined flu shot.   Due for mammogram after 04/17/16.  Pt aware needs yearly lipid and cmp. Pt moving before can have drawn.   Obesity- would like to start phentermine. Discussed side effects. Discussed needs to find PCP in [place] that will rx this again. Will not provide refills.   Left ankle swelling- lasix as needed refilled.   Elevated fasting glucose- never followed up on. Per pt will have labs done when she gets established with new PCP in [place].   Vitamin D def- continue vitamin D recommend having checked again.   See After Visit Summary for Counseling Recommendations

## 2016-03-18 ENCOUNTER — Other Ambulatory Visit: Payer: Self-pay | Admitting: Physician Assistant

## 2016-03-18 ENCOUNTER — Encounter: Payer: Self-pay | Admitting: Physician Assistant

## 2016-03-18 DIAGNOSIS — E669 Obesity, unspecified: Secondary | ICD-10-CM | POA: Insufficient documentation

## 2016-05-23 IMAGING — DX DG LUMBAR SPINE COMPLETE 4+V
5 series · 5 of 5 positions shown · non-contrast
Comparison: None in PACs

CLINICAL DATA: Right lower back pain radiating into the right leg
for the past month without known injury

EXAM:
LUMBAR SPINE - COMPLETE 4+ VIEW

[l-spine ap]
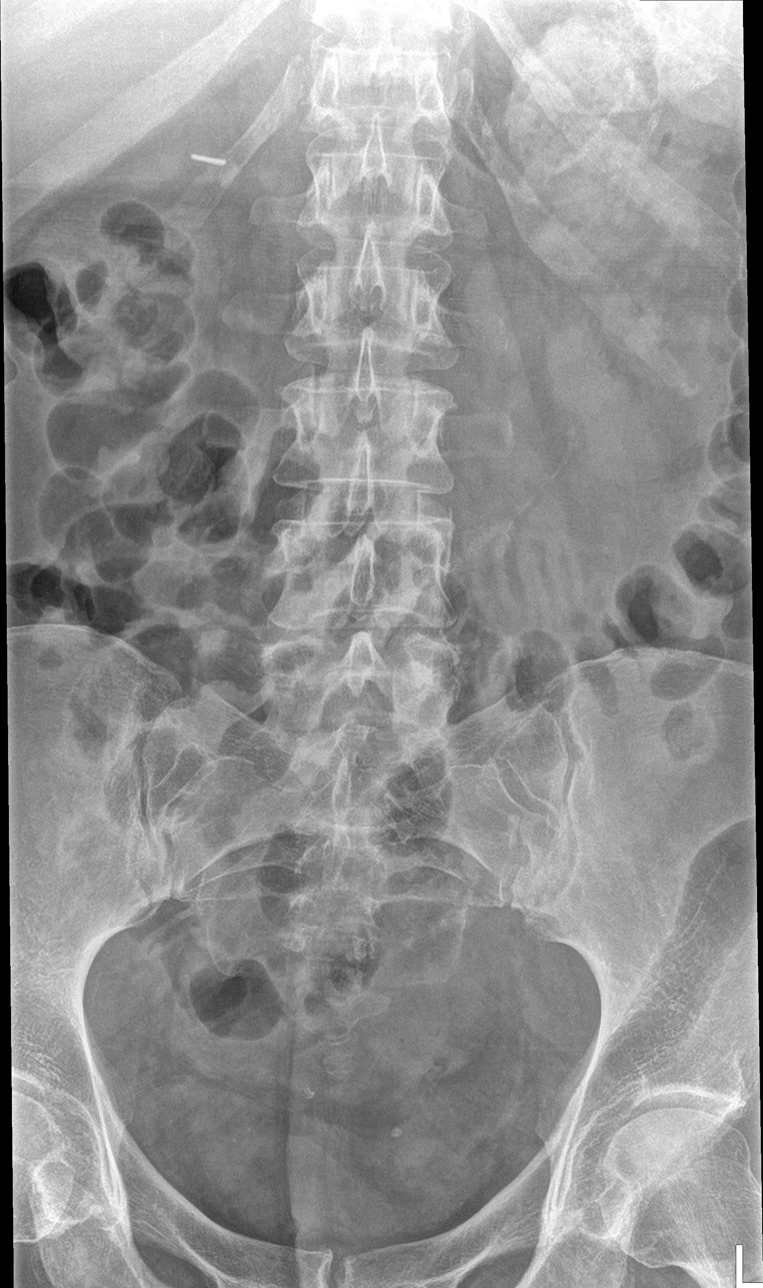

[l-spine obl (1 of 2)]
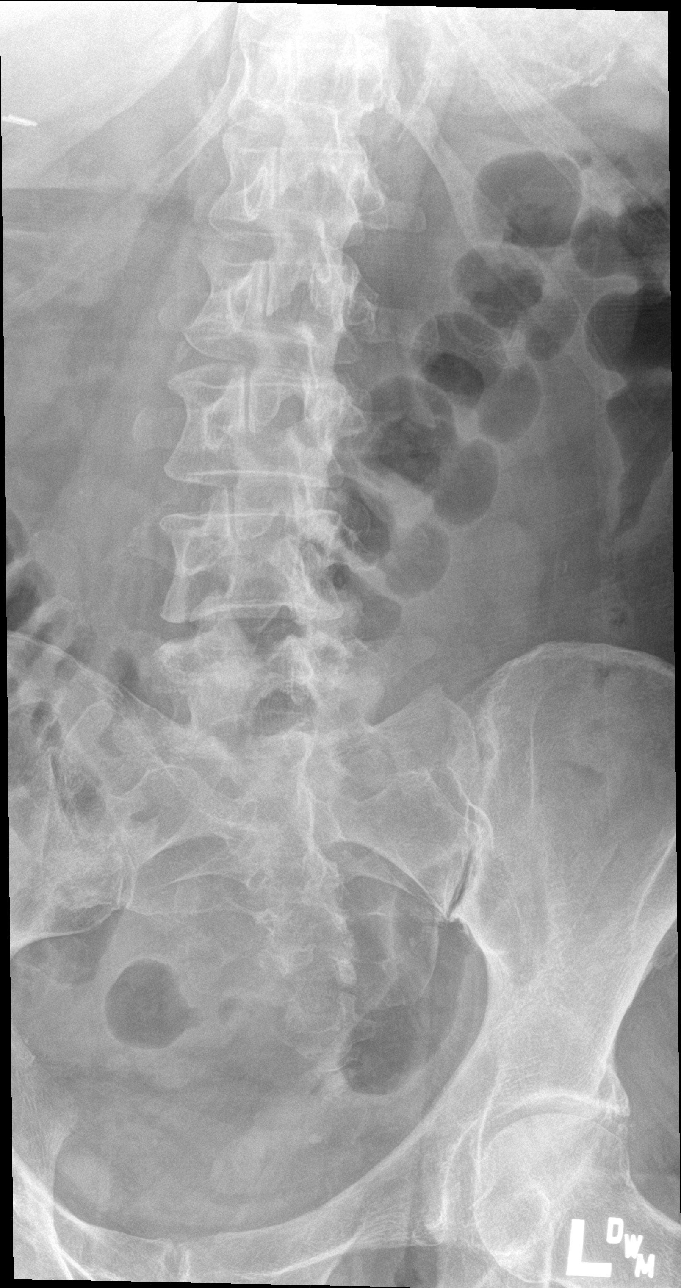

[l-spine obl (2 of 2)]
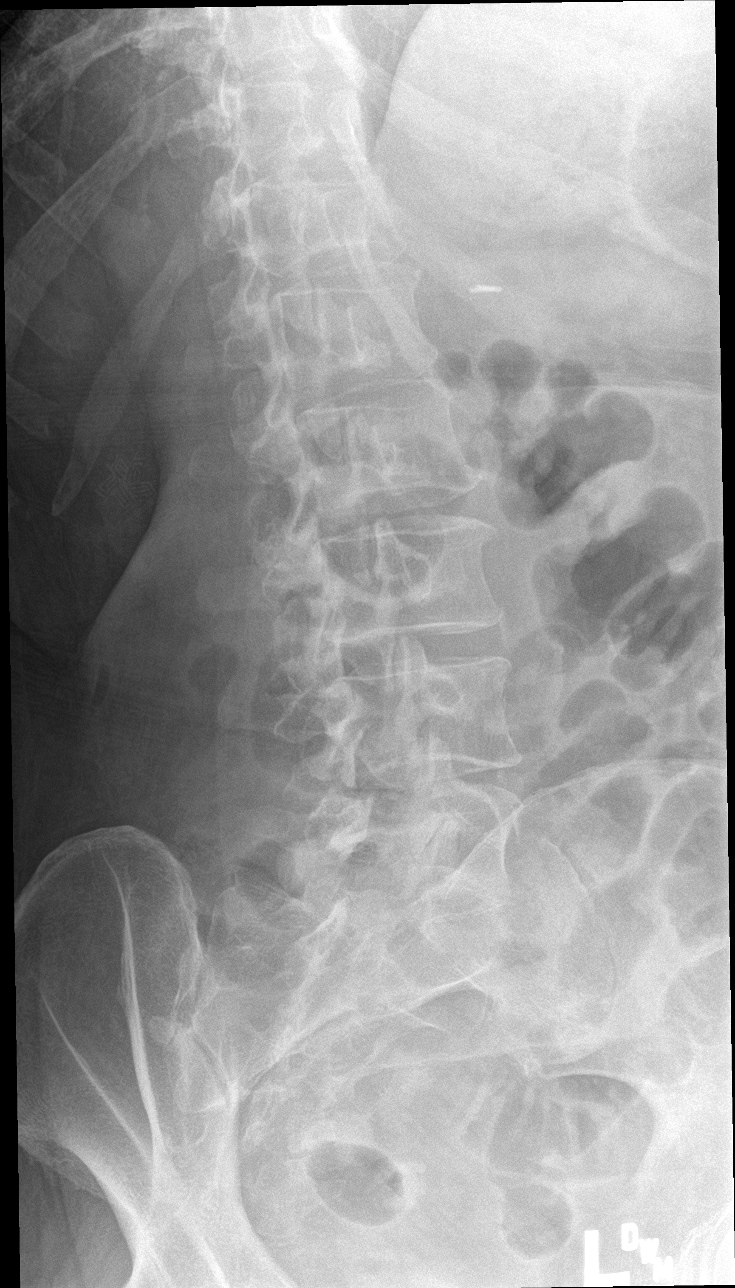

[l-spine lat]
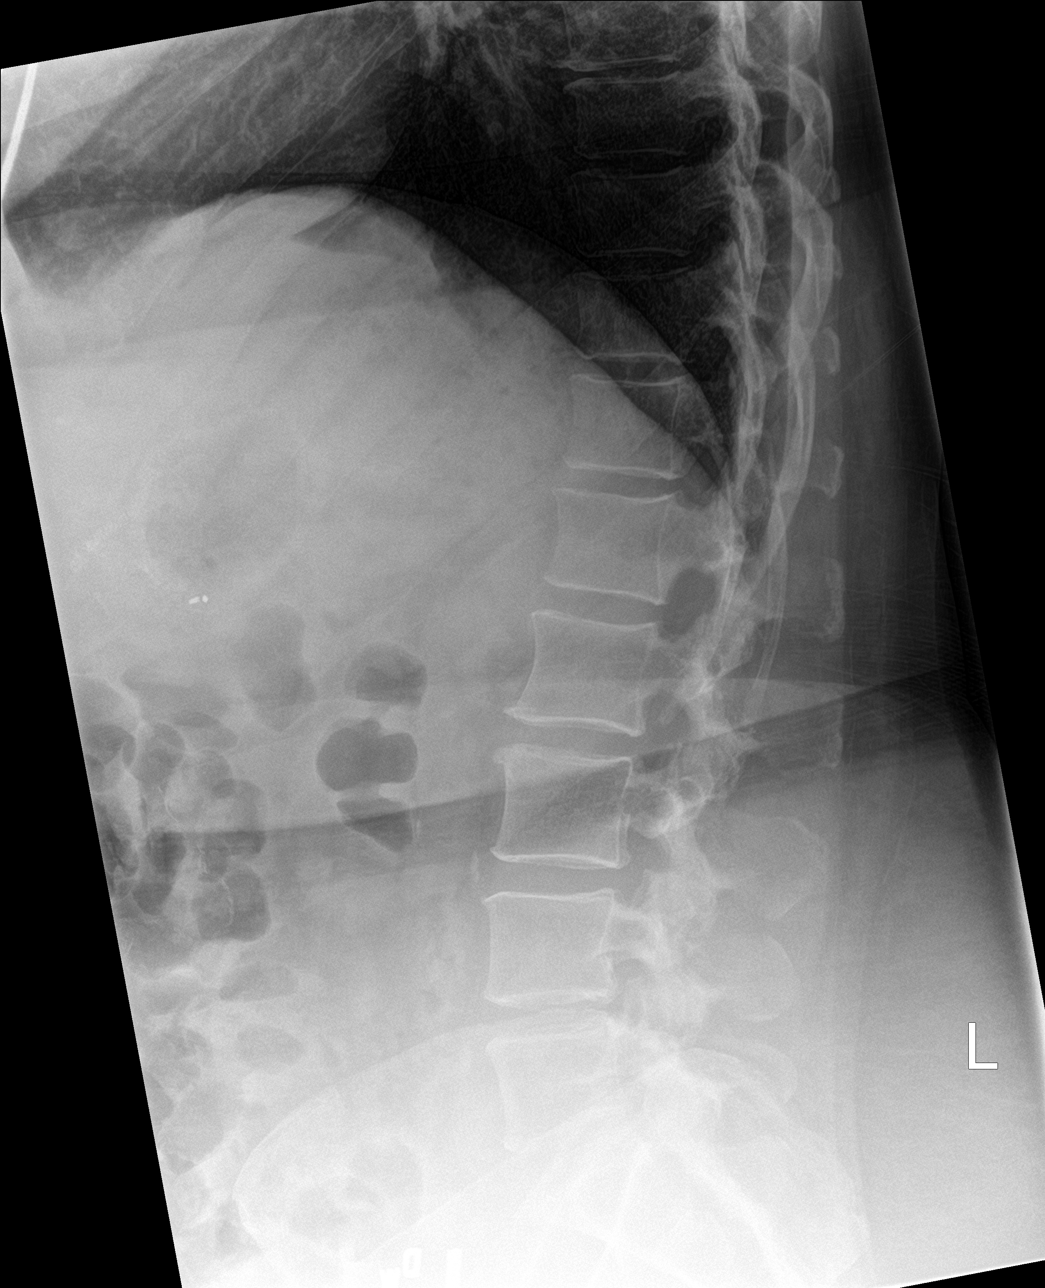

[l-spine spot]
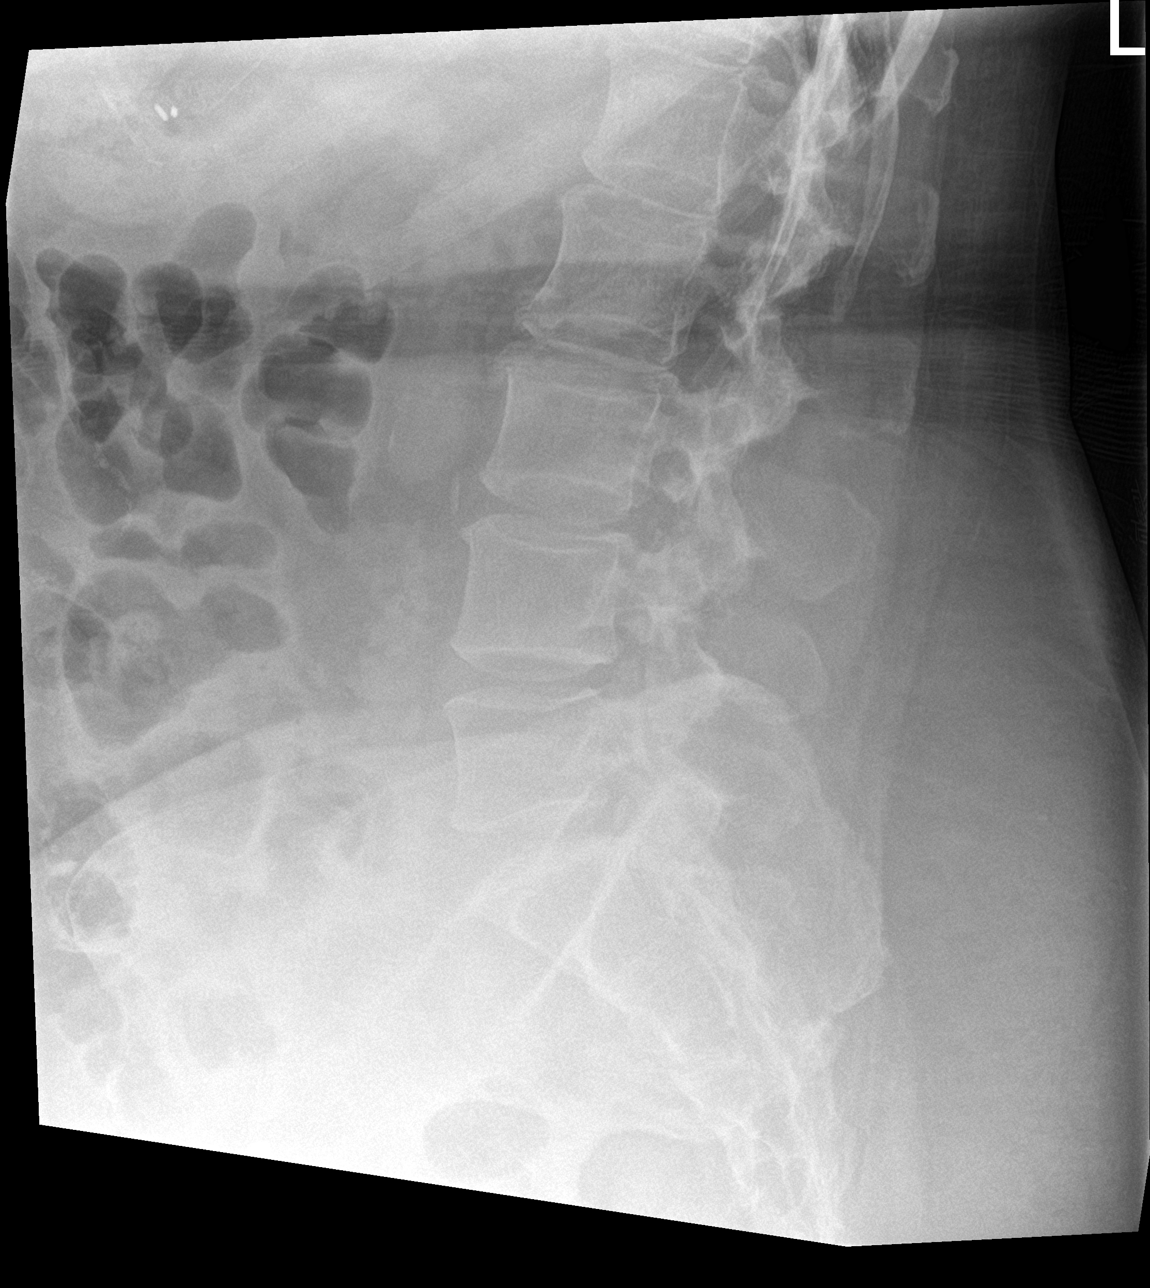

[5 of 5 positions shown; findings below may reference images not displayed]

FINDINGS: The lumbar vertebral bodies are preserved in height. There is mild
disc space narrowing at L4-5 and to a lesser extent at L2-3. There
is no spondylolisthesis. The pedicles and transverse processes are
intact. The observed portions of the sacrum are normal.
IMPRESSION: There is very mild degenerative change centered at L2-3 and at L4-5.
There is no acute bony abnormality. If the patient's radicular
symptoms persist, MRI of the lumbar spine would be a useful next
imaging step.

## 2018-06-03 ENCOUNTER — Telehealth: Payer: Self-pay | Admitting: Physician Assistant

## 2018-06-03 NOTE — Telephone Encounter (Signed)
..  Call patient: We do not have up to date mammogram. If they have had done please get record. If they have not gotten on ok to order and encourage to get it.   Ask about flu shot.   Pt needs CPE.

## 2018-06-06 NOTE — Telephone Encounter (Signed)
Patient is on a list to be called.
